# Patient Record
Sex: Female | Born: 1995 | Race: White | Hispanic: No | Marital: Single | Smoking: Never smoker
Health system: Southern US, Community
[De-identification: ages and names within clinical notes are randomized; demographics above are authoritative.]

---

## 2015-10-17 ENCOUNTER — Ambulatory Visit (INDEPENDENT_AMBULATORY_CARE_PROVIDER_SITE_OTHER): Payer: 59 | Admitting: Physician Assistant

## 2015-10-17 ENCOUNTER — Encounter: Payer: Self-pay | Admitting: Physician Assistant

## 2015-10-17 DIAGNOSIS — Z30017 Encounter for initial prescription of implantable subdermal contraceptive: Secondary | ICD-10-CM

## 2015-10-17 NOTE — Progress Notes (Signed)
   Andrea Griffith  MRN: [id number] DOB: Jun 17, 1996  Subjective:  Pt presents to clinic for nexplanon insertion.  She is currently on no birth control but she is currently not sexually active but wants to be protected in case she does become sexually active.  She started her menses 2 days ago and is currently bleeding.  There are no active problems to display for this patient.   No current outpatient prescriptions on file prior to visit.   No current facility-administered medications on file prior to visit.    No Known Allergies  Review of Systems Objective:  BP 111/67 mmHg  Pulse 52  Temp(Src) 97.9 F (36.6 C) (Oral)  Resp 16  Ht 5' 3.5" (1.613 m)  Wt 147 lb (66.679 kg)  BMI 25.63 kg/m2  SpO2 97%  LMP 10/14/2015  Physical Exam  Constitutional: She is oriented to person, place, and time and well-developed, well-nourished, and in no distress.  HENT:  Head: Normocephalic and atraumatic.  Right Ear: Hearing and external ear normal.  Left Ear: Hearing and external ear normal.  Eyes: Conjunctivae are normal.  Neck: Normal range of motion.  Pulmonary/Chest: Effort normal.  Neurological: She is alert and oriented to person, place, and time. Gait normal.  Skin: Skin is warm and dry.  Psychiatric: Mood, memory, affect and judgment normal.  Vitals reviewed.  Procedure:  Verbal consent obtained.  Area marked on her right arm (non-dominant) 8cm above the medial epicondyle and cleaned with betadine.  Local anesthesia with 2% lido plain into the insertion area and then along the track.  The nexplanon was inserted using the device without difficulty.  Steri-strips placed on the area and pressure drsg placed.  Wound care d/w pt.  Lot # [id number], Exp 08/2017  Assessment and Plan :  Nexplanon insertion placed without problems - We discussed potential risks vs benefits of the nexplanon and patient's questions were answered prior to the procedure being done.  Benny LennertSarah Weber PA-C  [facility] 10/17/2015 4:56 PM

## 2015-10-24 ENCOUNTER — Ambulatory Visit: Payer: Self-pay | Admitting: Physician Assistant

## 2017-10-09 ENCOUNTER — Encounter (HOSPITAL_COMMUNITY): Payer: Self-pay | Admitting: Emergency Medicine

## 2017-10-09 ENCOUNTER — Ambulatory Visit (HOSPITAL_COMMUNITY)
Admission: EM | Admit: 2017-10-09 | Discharge: 2017-10-09 | Disposition: A | Discharge status: Home / Self Care | Payer: PRIVATE HEALTH INSURANCE | Attending: Family Medicine | Admitting: Family Medicine

## 2017-10-09 DIAGNOSIS — J039 Acute tonsillitis, unspecified: Secondary | ICD-10-CM | POA: Insufficient documentation

## 2017-10-09 DIAGNOSIS — J029 Acute pharyngitis, unspecified: Secondary | ICD-10-CM | POA: Diagnosis present

## 2017-10-09 LAB — POCT RAPID STREP A: STREPTOCOCCUS, GROUP A SCREEN (DIRECT): NEGATIVE

## 2017-10-09 MED ORDER — CEFDINIR 300 MG PO CAPS: 600.0000 mg | ORAL_CAPSULE | Freq: Every day | ORAL | 0 refills | Status: DC

## 2017-10-09 NOTE — ED Triage Notes (Signed)
Pt c/o ST onset 6 days associated w/BA, prod cough, white patches on throat  Denies fevers  Taking OTC cold meds w/temp relief.   A&O x4... NAD.... Ambulatory

## 2017-10-09 NOTE — ED Provider Notes (Signed)
  [facility]   [id number] 10/09/17 Arrival Time: 1821   SUBJECTIVE:  Andrea Griffith is a 21 y.o. female who presents to the urgent care with complaint of ST onset 6 days associated w/BA, prod cough, white patches on throat  Denies fevers  Taking OTC cold meds w/temp relief.   [employer]  History reviewed. No pertinent past medical history. Family History  Problem Relation Age of Onset  . Diabetes Paternal Grandfather   . Hyperlipidemia Paternal Grandfather    Social History   Social History  . Marital status: Single    Spouse name: N/A  . Number of children: N/A  . Years of education: N/A   Occupational History  . Not on file.   Social History Main Topics  . Smoking status: Never Smoker  . Smokeless tobacco: Never Used  . Alcohol use 0.0 oz/week  . Drug use: No  . Sexual activity: Yes    Birth control/ protection: Condom   Other Topics Concern  . Not on file   Social History Narrative  . No narrative on file   No outpatient prescriptions have been marked as taking for the 10/09/17 encounter [facility](Hospital Encounter).   No Known Allergies    ROS: As per HPI, remainder of ROS negative.   OBJECTIVE:   Vitals:   10/09/17 1857  BP: (!) 111/55  Pulse: 72  Resp: 20  Temp: 99.8 F (37.7 C)  TempSrc: Oral  SpO2: 97%     General appearance: alert; no distress Eyes: PERRL; EOMI; conjunctiva normal HENT: normocephalic; atraumatic; TMs normal, canal normal, external ears normal without trauma; nasal mucosa normal; tonsils are 3+ and coated. Neck: supple with mild adenopathy Back: no CVA tenderness Extremities: no cyanosis or edema; symmetrical with no gross deformities Skin: warm and dry Neurologic: normal gait; grossly normal Psychological: alert and cooperative; normal mood and affect      Labs:   Labs Reviewed  CULTURE, GROUP A STREP [facility])  POCT RAPID STREP A    No results found.     ASSESSMENT &  PLAN:  1. Tonsillitis     Meds ordered this encounter  Medications  . cefdinir (OMNICEF) 300 MG capsule    Sig: Take 2 capsules (600 mg total) by mouth daily.    Dispense:  20 capsule    Refill:  0    Reviewed expectations re: course of current medical issues. Questions answered. Outlined signs and symptoms indicating need for more acute intervention. Patient verbalized understanding. After Visit Summary given.    Procedures:      Elvina SidleLauenstein, Brooks Kinnan, MD 10/09/17 1909

## 2017-10-09 NOTE — Discharge Instructions (Signed)
Warm salt water gargles will also help

## 2017-10-11 LAB — CULTURE, GROUP A STREP (THRC)

## 2017-11-09 ENCOUNTER — Other Ambulatory Visit: Payer: Self-pay

## 2017-11-09 ENCOUNTER — Encounter (HOSPITAL_COMMUNITY): Payer: Self-pay | Admitting: Emergency Medicine

## 2017-11-09 ENCOUNTER — Ambulatory Visit (HOSPITAL_COMMUNITY)
Admission: EM | Admit: 2017-11-09 | Discharge: 2017-11-09 | Disposition: A | Discharge status: Home / Self Care | Payer: PRIVATE HEALTH INSURANCE | Attending: Family Medicine | Admitting: Family Medicine

## 2017-11-09 DIAGNOSIS — J029 Acute pharyngitis, unspecified: Secondary | ICD-10-CM | POA: Diagnosis present

## 2017-11-09 DIAGNOSIS — R110 Nausea: Secondary | ICD-10-CM | POA: Insufficient documentation

## 2017-11-09 LAB — POCT RAPID STREP A: STREPTOCOCCUS, GROUP A SCREEN (DIRECT): NEGATIVE

## 2017-11-09 MED ORDER — AMOXICILLIN 500 MG PO CAPS: 500.0000 mg | ORAL_CAPSULE | Freq: Three times a day (TID) | ORAL | 0 refills | Status: AC

## 2017-11-09 NOTE — Discharge Instructions (Addendum)
Strep test negative. We will go ahead and treat you for strep since last time your culture came back positive for a different type of strep.   [facility] [address]  If the culture comes back negative, we may have you stop the antibiotic. For symptom control you may also try : -A daily allergy pill like Zyrtec, Claritin, or Store brand (anti-histamine) consistently for 2 weeks  -For congestion you may try an oral decongestant like Mucinex or sudafed. You may also try flonase nasal spray or saline irrigations (neti pot, sinus cleanse)  - For your sore throat you may try cepacol lozenges, salt water gargles  -Take Tylenol or Ibuprofen to help with pain/inflammation  -Stay hydrated, drink plenty of fluids

## 2017-11-09 NOTE — ED Triage Notes (Signed)
Pt reports a sore throat since yesterday.  She states it is the same symptoms as previously when she was here 10/27.  She also reports a lot of nasal congestion.

## 2017-11-09 NOTE — ED Provider Notes (Signed)
[facility]    CSN: [id number] Arrival date & time: 11/09/17  1219     History   Chief Complaint Chief Complaint  Patient presents with  . Sore Throat    HPI Andrea Griffith is a 21 y.o. female presenting with 2 days of sore throat and congestion. Has taken OTC cold medicine for congestion. Was seen a few weeks ago for similar symptoms, rapid strep negative, culture grew different strep species. Treated with cefdinir. Symptoms resolved temporarily but have now returned. Denies fever, cough, headache. Endorses nausea but no vomiting. No chest pain or shortness of breath.   HPI  History reviewed. No pertinent past medical history.  There are no active problems to display for this patient.   History reviewed. No pertinent surgical history.  OB History    No data available       Home Medications    Prior to Admission medications   Medication Sig Start Date End Date Taking? Authorizing Provider  amoxicillin (AMOXIL) 500 MG capsule Take 1 capsule (500 mg total) by mouth 3 (three) times daily for 5 days. 11/09/17 11/14/17  Cloa Bushong, Junius CreamerHallie C, PA-C    Family History Family History  Problem Relation Age of Onset  . Diabetes Paternal Grandfather   . Hyperlipidemia Paternal Grandfather     Social History Social History   Tobacco Use  . Smoking status: Never Smoker  . Smokeless tobacco: Never Used  Substance Use Topics  . Alcohol use: Yes    Alcohol/week: 0.0 oz  . Drug use: No     Allergies   Patient has no known allergies.   Review of Systems Review of Systems  Constitutional: Positive for fatigue. Negative for fever.  HENT: Positive for congestion, postnasal drip and sore throat. Negative for ear pain.   Respiratory: Negative for cough and shortness of breath.   Cardiovascular: Negative for chest pain.  Gastrointestinal: Positive for nausea. Negative for abdominal pain and vomiting.  Musculoskeletal: Positive for myalgias. Negative for neck pain  and neck stiffness.     Physical Exam Triage Vital Signs ED Triage Vitals  Enc Vitals Group     BP 11/09/17 1317 (!) 103/55     Pulse Rate 11/09/17 1317 (!) 55     Resp --      Temp 11/09/17 1317 98.4 F (36.9 C)     Temp Source 11/09/17 1317 Oral     SpO2 11/09/17 1317 97 %     Weight --      Height --      Head Circumference --      Peak Flow --      Pain Score 11/09/17 1318 4     Pain Loc --      Pain Edu? --      Excl. in GC? --    No data found.  Updated Vital Signs BP (!) 103/55 (BP Location: Left Arm)   Pulse (!) 55   Temp 98.4 F (36.9 C) (Oral)   LMP 11/05/2017 (Exact Date)   SpO2 97%   Physical Exam  Constitutional: She appears well-developed and well-nourished.  HENT:  Head: Normocephalic and atraumatic.  Right Ear: Tympanic membrane and ear canal normal. Tympanic membrane is not erythematous.  Left Ear: Tympanic membrane and ear canal normal. Tympanic membrane is not erythematous.  Nose: Rhinorrhea present.  Mouth/Throat: No uvula swelling. Posterior oropharyngeal erythema present. Tonsils are 3+ on the right. Tonsils are 3+ on the left. No tonsillar exudate.  Tonsills enlarged bilaterally  and erythematous, no white plaques today.  Neck: Full passive range of motion without pain.  One palpable node on right anterior cervical chain, nontender      UC Treatments / Results  Labs (all labs ordered are listed, but only abnormal results are displayed) Labs Reviewed  POCT RAPID STREP A    EKG  EKG Interpretation None       Radiology No results found.  Procedures Procedures (including critical care time)  Medications Ordered in UC Medications - No data to display   Initial Impression / Assessment and Plan / UC Course  I have reviewed the triage vital signs and the nursing notes.  Pertinent labs & imaging results that were available during my care of the patient were reviewed by me and considered in my medical decision making (see chart  for details).    Previous visit she grew non-Group A strep species on culture. Given patient is returning in close proximity with similar symptoms, patient was given Amoxicillin to try. Advised if culture comes back negative we may have her stop the antibiotic and continue with symptom control.   -A daily allergy pill like Zyrtec, Claritin, or Store brand (anti-histamine) consistently for 2 weeks  -For congestion you may try an oral decongestant like Mucinex or sudafed. You may also try flonase nasal spray or saline irrigations (neti pot, sinus cleanse)  - For your sore throat you may try cepacol lozenges, salt water gargles  -Take Tylenol or Ibuprofen to help with pain/inflammation  -Stay hydrated, drink plenty of fluids  Final Clinical Impressions(s) / UC Diagnoses   Final diagnoses:  Acute pharyngitis, unspecified etiology    ED Discharge Orders        Ordered    amoxicillin (AMOXIL) 500 MG capsule  3 times daily     11/09/17 1355       Controlled Substance Prescriptions [place] Controlled Substance Registry consulted? Not Applicable   Lew DawesWieters, Keyara Ent C, [place]PA-C 11/09/17 1413

## 2017-11-12 LAB — CULTURE, GROUP A STREP (THRC)

## 2018-05-06 ENCOUNTER — Encounter (HOSPITAL_COMMUNITY): Payer: Self-pay | Admitting: Emergency Medicine

## 2018-05-06 ENCOUNTER — Ambulatory Visit (INDEPENDENT_AMBULATORY_CARE_PROVIDER_SITE_OTHER): Payer: PRIVATE HEALTH INSURANCE

## 2018-05-06 ENCOUNTER — Ambulatory Visit (HOSPITAL_COMMUNITY)
Admission: EM | Admit: 2018-05-06 | Discharge: 2018-05-06 | Disposition: A | Discharge status: Home / Self Care | Payer: PRIVATE HEALTH INSURANCE | Attending: Internal Medicine | Admitting: Internal Medicine

## 2018-05-06 DIAGNOSIS — R05 Cough: Secondary | ICD-10-CM | POA: Diagnosis not present

## 2018-05-06 DIAGNOSIS — J209 Acute bronchitis, unspecified: Secondary | ICD-10-CM

## 2018-05-06 MED ORDER — PREDNISONE 20 MG PO TABS: 20.0000 mg | ORAL_TABLET | Freq: Every day | ORAL | 0 refills | Status: AC

## 2018-05-06 MED ORDER — ALBUTEROL SULFATE HFA 108 (90 BASE) MCG/ACT IN AERS
1.0000 | INHALATION_SPRAY | Freq: Four times a day (QID) | RESPIRATORY_TRACT | 0 refills | Status: AC | PRN
Start: 2018-05-06 — End: ?

## 2018-05-06 MED ORDER — IPRATROPIUM-ALBUTEROL 0.5-2.5 (3) MG/3ML IN SOLN
3.0000 mL | Freq: Once | RESPIRATORY_TRACT | Status: AC
  Administered 2018-05-06: 3 mL via RESPIRATORY_TRACT

## 2018-05-06 MED ORDER — IPRATROPIUM-ALBUTEROL 0.5-2.5 (3) MG/3ML IN SOLN
RESPIRATORY_TRACT | Status: AC
  Filled 2018-05-06: qty 3

## 2018-05-06 MED ORDER — AZITHROMYCIN 250 MG PO TABS: 250.0000 mg | ORAL_TABLET | Freq: Every day | ORAL | 0 refills | Status: AC

## 2018-05-06 MED ORDER — BENZONATATE 100 MG PO CAPS: 100.0000 mg | ORAL_CAPSULE | Freq: Three times a day (TID) | ORAL | 0 refills | Status: AC

## 2018-05-06 NOTE — ED Triage Notes (Signed)
Pt c/o congestion x1 week, chest congestion and headache with cough.

## 2018-05-06 NOTE — ED Provider Notes (Signed)
[facility]    CSN: [id number] Arrival date & time: 05/06/18  1532     History   Chief Complaint Chief Complaint  Patient presents with  . Cough    HPI Inella Kuwahara is a 22 y.o. female.   Subjective:   Alissandra Geoffroy is a 22 y.o. female here for evaluation of a cough.  The cough is productive of clear sputum, productive of green/yellow sputum, with wheezing, shortness of breath during the cough, chest is painful during coughing, waxing and waning over time and is aggravated by reclining position. Onset of symptoms was 7 days ago and has been unchanged since that time.  Patient does not have a history of asthma. Patient has been on a cruise for the past 5 days. Patient does not have a history of smoking. Patient has not had a previous chest x-ray.  The following portions of the patient's history were reviewed and updated as appropriate: allergies, current medications, past family history, past medical history, past social history, past surgical history and problem list.           History reviewed. No pertinent past medical history.  There are no active problems to display for this patient.   History reviewed. No pertinent surgical history.  OB History   None      Home Medications    Prior to Admission medications   Medication Sig Start Date End Date Taking? Authorizing Provider  AMPICILLIN PO Take by mouth.   Yes [provider]    Family History Family History  Problem Relation Age of Onset  . Diabetes Paternal Grandfather   . Hyperlipidemia Paternal Grandfather     Social History Social History   Tobacco Use  . Smoking status: Never Smoker  . Smokeless tobacco: Never Used  Substance Use Topics  . Alcohol use: Yes    Alcohol/week: 0.0 oz  . Drug use: No     Allergies   Patient has no known allergies.   Review of Systems Review of Systems   Physical Exam Triage Vital Signs ED Triage Vitals [05/06/18 1555]  Enc  Vitals Group     BP (!) 121/58     Pulse Rate 65     Resp 14     Temp 98.5 F (36.9 C)     Temp src      SpO2 100 %     Weight      Height      Head Circumference      Peak Flow      Pain Score      Pain Loc      Pain Edu?      Excl. in GC?    No data found.  Updated Vital Signs BP (!) 121/58   Pulse 65   Temp 98.5 F (36.9 C)   Resp 14   SpO2 100%   Visual Acuity Right Eye Distance:   Left Eye Distance:   Bilateral Distance:    Right Eye Near:   Left Eye Near:    Bilateral Near:     Physical Exam   UC Treatments / Results  Labs (all labs ordered are listed, but only abnormal results are displayed) Labs Reviewed - No data to display  EKG None  Radiology No results found.  Procedures Procedures (including critical care time)  Medications Ordered in UC Medications - No data to display  Initial Impression / Assessment and Plan / UC Course  I have reviewed the triage vital  signs and the nursing notes.  Pertinent labs & imaging results that were available during my care of the patient were reviewed by me and considered in my medical decision making (see chart for details).    22 year old female presenting with a one-week history of productive cough, wheezing, shortness of breath and pleuritic chest pain. This tried several over-the-counter remedies without much improvement in her symptoms. Physical exam reveals diffuse wheezing. Vital signs stable. Oxygen saturations 100% on room air. Nontoxic-appearing. Patient received DuoNeb in clinic with great relief. Wheezing minimal.  CXR negative for any acute cardiopulmonary disease.   Plan: 1. Z-Pak as directed 2. Prednisone 20 mg daily 5 days 3. Tessalon Perles as needed for cough 4. Albuterol inhaler as needed for shortness of breath and wheezing 5. Call if shortness of breath worsens, blood in sputum, change in character of cough, development of fever or chills, inability to maintain nutrition and  hydration.  6. Avoid exposure to tobacco smoke and fumes. 7. Follow-up as needed   Evaluation has revealed no signs of a dangerous process. Patient aware of findings.  Discussed diagnosis, treatment plan, possible red flag symptoms to watch out for, and the need for close follow up. Patient understands verbal as well as written discharge instructions, is warned regarding their illness and prognosis, is able to repeat any risks, and is comfortable with plan and disposition. Has a clear mental status at this time, good insight into illness (after discussion and teaching) and has clear judgment to make decisions regarding their care.  Documentation was completed with the aid of voice recognition software. Transcription may contain typographical errors. Final Clinical Impressions(s) / UC Diagnoses   Final diagnoses:  Acute bronchitis, unspecified organism   Discharge Instructions   None    ED Prescriptions    None     Controlled Substance Prescriptions [place] Controlled Substance Registry consulted? Not Applicable   Lurline Idol, [place] 05/06/18 1819

## 2019-05-07 IMAGING — DX DG CHEST 2V
2 series · 2 of 2 positions shown · non-contrast
Comparison: None.

CLINICAL DATA: Cough and congestion

EXAM:
CHEST - 2 VIEW

[chest pa]
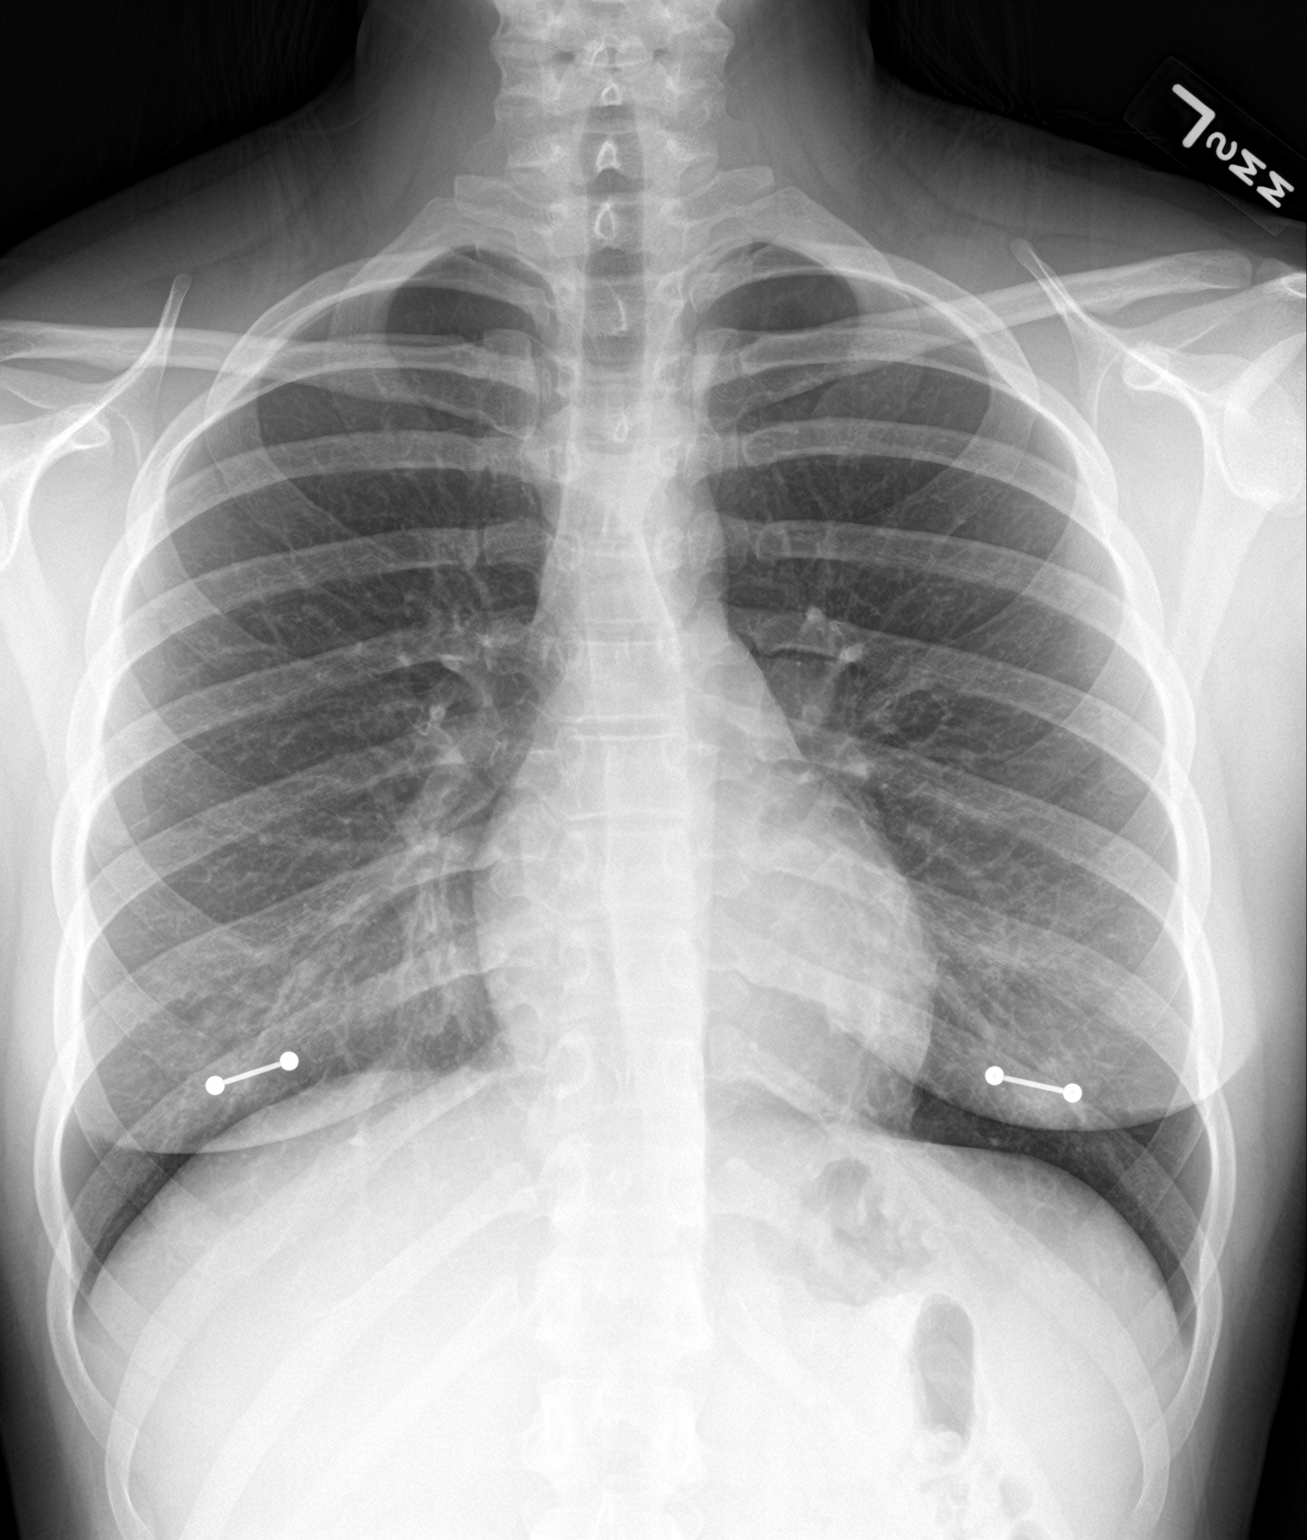

[chest lat]
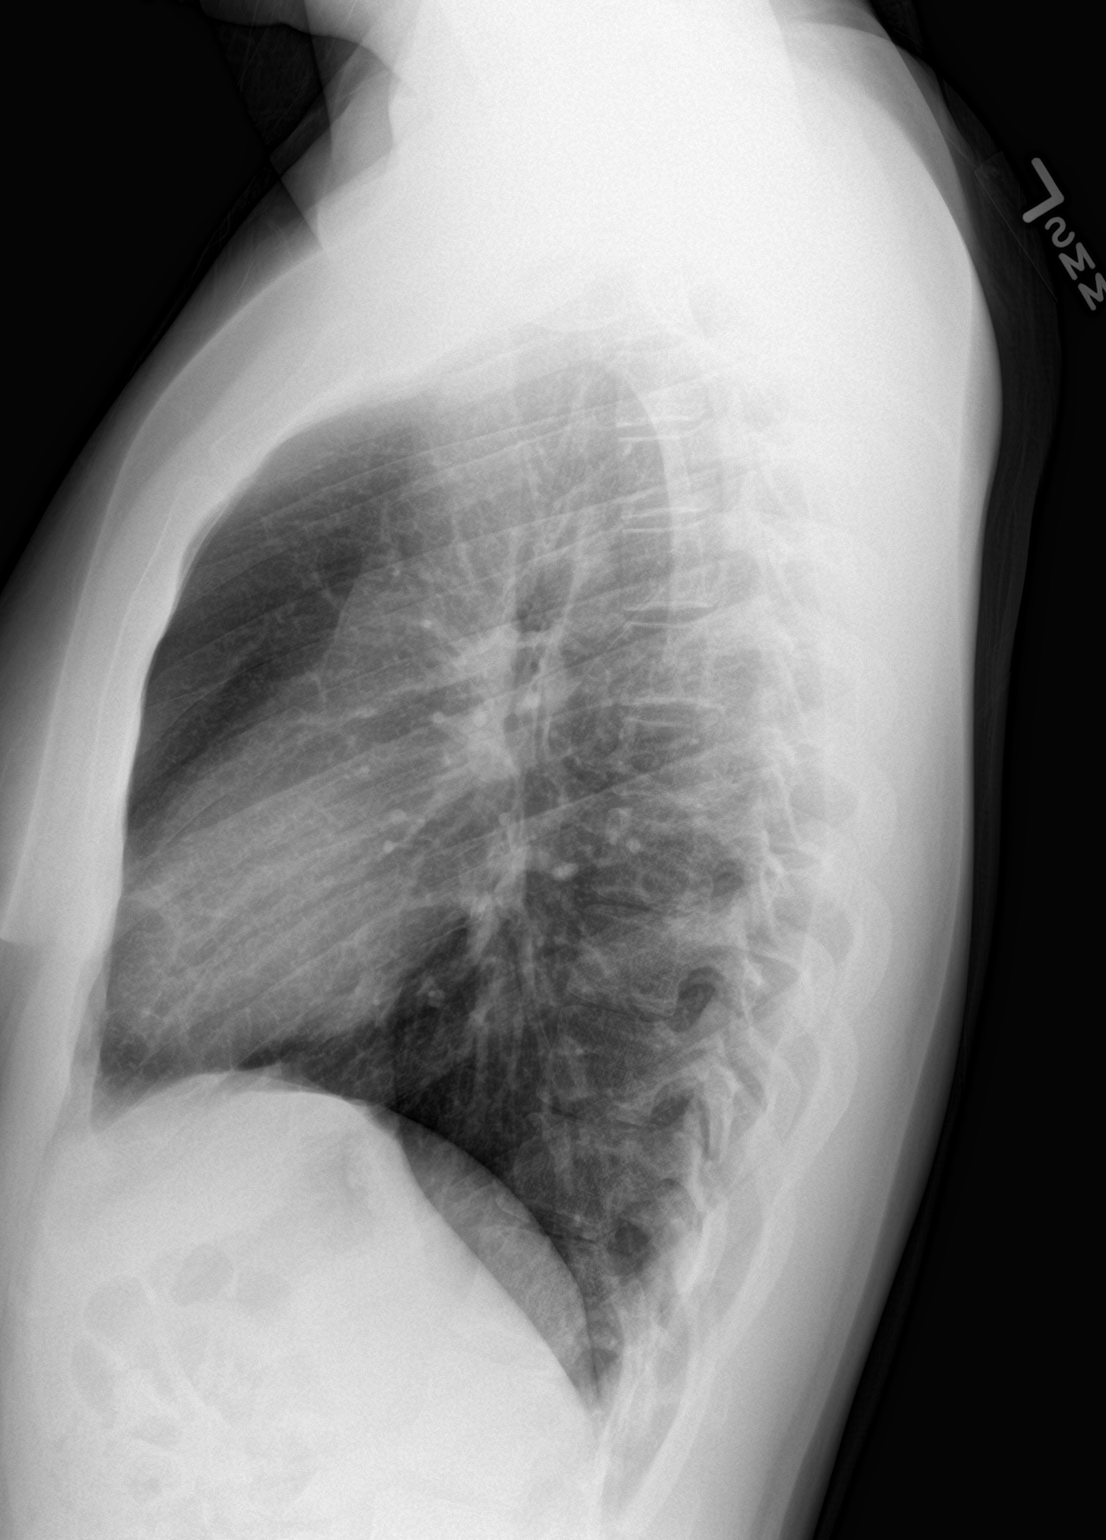

[2 of 2 positions shown; findings below may reference images not displayed]

FINDINGS: Lungs are clear. Heart size and pulmonary vascularity are normal. No
adenopathy. No bone lesions.
IMPRESSION: No edema or consolidation.
